# Patient Record
Sex: Male | Born: 1965 | Race: Black or African American | Hispanic: No | Marital: Single | Smoking: Never smoker
Health system: Southern US, Community
[De-identification: ages and names within clinical notes are randomized; demographics above are authoritative.]

## PROBLEM LIST (undated history)

## (undated) DIAGNOSIS — Z8711 Personal history of peptic ulcer disease: Secondary | ICD-10-CM

## (undated) DIAGNOSIS — Z8719 Personal history of other diseases of the digestive system: Secondary | ICD-10-CM

## (undated) DIAGNOSIS — I10 Essential (primary) hypertension: Secondary | ICD-10-CM

## (undated) HISTORY — PX: ABDOMINAL SURGERY: SHX537

---

## 2011-03-12 ENCOUNTER — Emergency Department: Payer: Self-pay | Admitting: Emergency Medicine

## 2015-01-17 ENCOUNTER — Other Ambulatory Visit: Payer: Self-pay | Admitting: Thoracic Surgery

## 2015-01-17 ENCOUNTER — Ambulatory Visit
Admission: RE | Admit: 2015-01-17 | Discharge: 2015-01-17 | Disposition: A | Discharge status: Home / Self Care | Payer: Disability Insurance | Source: Ambulatory Visit | Attending: Thoracic Surgery | Admitting: Thoracic Surgery

## 2015-01-17 DIAGNOSIS — M1711 Unilateral primary osteoarthritis, right knee: Secondary | ICD-10-CM | POA: Diagnosis not present

## 2015-01-17 DIAGNOSIS — M1990 Unspecified osteoarthritis, unspecified site: Secondary | ICD-10-CM

## 2015-01-17 DIAGNOSIS — M25562 Pain in left knee: Secondary | ICD-10-CM | POA: Diagnosis present

## 2015-01-17 DIAGNOSIS — M25561 Pain in right knee: Secondary | ICD-10-CM | POA: Diagnosis present

## 2015-03-04 ENCOUNTER — Emergency Department
Admission: EM | Admit: 2015-03-04 | Discharge: 2015-03-04 | Discharge status: Against Medical Advice | Payer: Medicaid Other | Attending: Emergency Medicine | Admitting: Emergency Medicine

## 2015-03-04 ENCOUNTER — Encounter: Payer: Self-pay | Admitting: Emergency Medicine

## 2015-03-04 DIAGNOSIS — I10 Essential (primary) hypertension: Secondary | ICD-10-CM | POA: Insufficient documentation

## 2015-03-04 DIAGNOSIS — R109 Unspecified abdominal pain: Secondary | ICD-10-CM | POA: Diagnosis not present

## 2015-03-04 HISTORY — DX: Personal history of other diseases of the digestive system: Z87.19

## 2015-03-04 HISTORY — DX: Essential (primary) hypertension: I10

## 2015-03-04 HISTORY — DX: Personal history of peptic ulcer disease: Z87.11

## 2015-03-04 LAB — URINALYSIS COMPLETE WITH MICROSCOPIC (ARMC ONLY)
BILIRUBIN URINE: NEGATIVE
Bacteria, UA: NONE SEEN
Glucose, UA: NEGATIVE mg/dL
KETONES UR: NEGATIVE mg/dL
LEUKOCYTES UA: NEGATIVE
NITRITE: NEGATIVE
PH: 7 (ref 5.0–8.0)
Protein, ur: NEGATIVE mg/dL
Specific Gravity, Urine: 1.025 (ref 1.005–1.030)
Squamous Epithelial / HPF: NONE SEEN

## 2015-03-04 LAB — COMPREHENSIVE METABOLIC PANEL
ALK PHOS: 49 U/L (ref 38–126)
ALT: 23 U/L (ref 17–63)
AST: 25 U/L (ref 15–41)
Albumin: 4.4 g/dL (ref 3.5–5.0)
Anion gap: 6 (ref 5–15)
BUN: 12 mg/dL (ref 6–20)
CALCIUM: 9.4 mg/dL (ref 8.9–10.3)
CHLORIDE: 101 mmol/L (ref 101–111)
CO2: 32 mmol/L (ref 22–32)
CREATININE: 1.43 mg/dL — AB (ref 0.61–1.24)
GFR, EST NON AFRICAN AMERICAN: 55 mL/min — AB (ref >60)
Glucose, Bld: 109 mg/dL — ABNORMAL HIGH (ref 65–99)
Potassium: 4.4 mmol/L (ref 3.5–5.1)
Sodium: 139 mmol/L (ref 135–145)
TOTAL PROTEIN: 7.8 g/dL (ref 6.5–8.1)
Total Bilirubin: 0.9 mg/dL (ref 0.3–1.2)

## 2015-03-04 LAB — CBC
HCT: 45.7 % (ref 40.0–52.0)
Hemoglobin: 16.2 g/dL (ref 13.0–18.0)
MCH: 32.5 pg (ref 26.0–34.0)
MCHC: 35.5 g/dL (ref 32.0–36.0)
MCV: 91.6 fL (ref 80.0–100.0)
PLATELETS: 192 10*3/uL (ref 150–440)
RBC: 4.99 MIL/uL (ref 4.40–5.90)
RDW: 13.1 % (ref 11.5–14.5)
WBC: 5 10*3/uL (ref 3.8–10.6)

## 2015-03-04 LAB — LIPASE, BLOOD: LIPASE: 49 U/L (ref 11–51)

## 2015-03-04 NOTE — ED Notes (Signed)
Pt lactose intolerant and tried to new silk milk, now having abd cramping and pain started this am. Denies any vomiting or diarrhea.

## 2016-05-13 IMAGING — CR DG KNEE 1-2V*L*
1 series · 2 of 2 positions shown · non-contrast
Comparison: None.

CLINICAL DATA: Arthritis.

EXAM:
LEFT KNEE - 1-2 VIEW

[Series 1: dg knee 1-2 views left · 0.14mm/px · 2 of 2 slices shown]
[im 1/2]
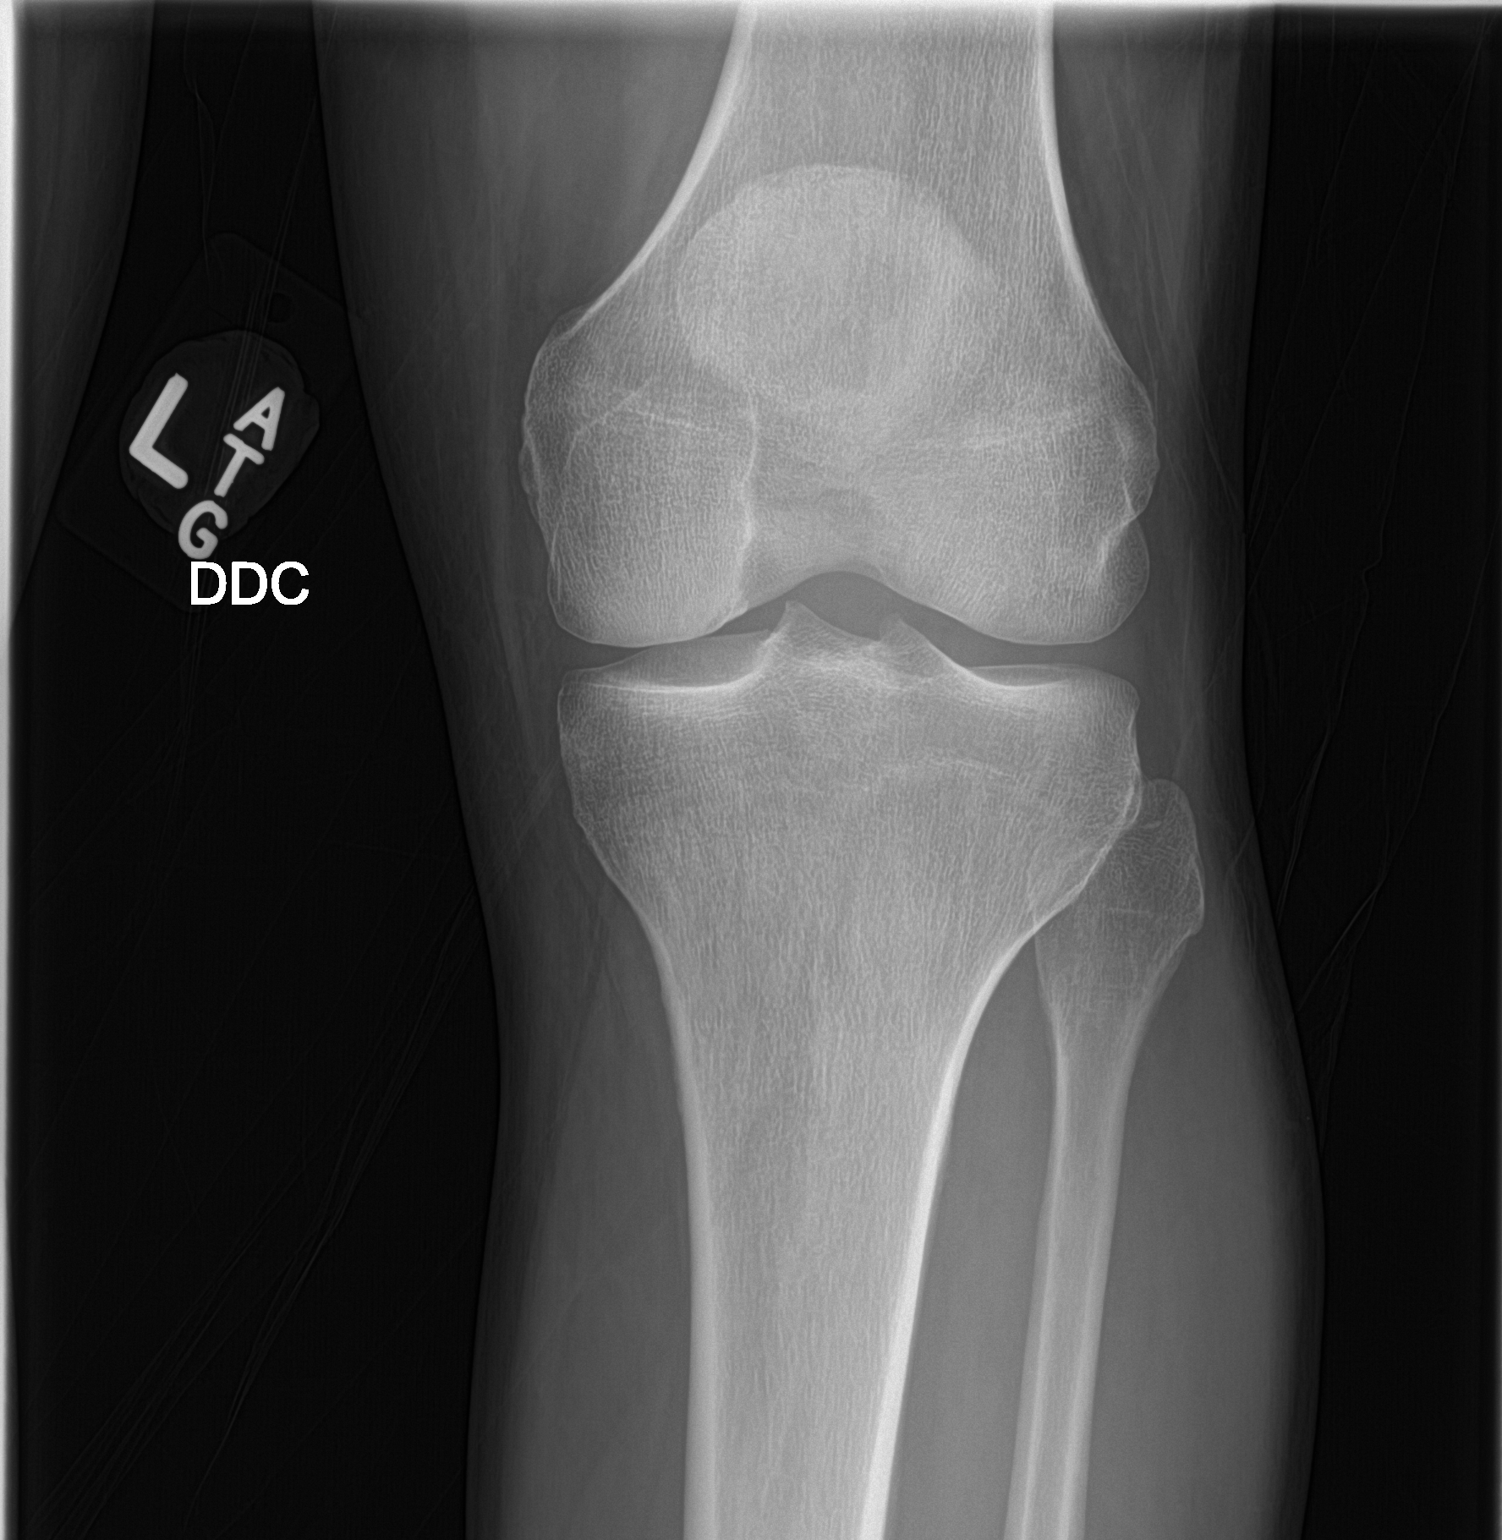
[im 2/2]
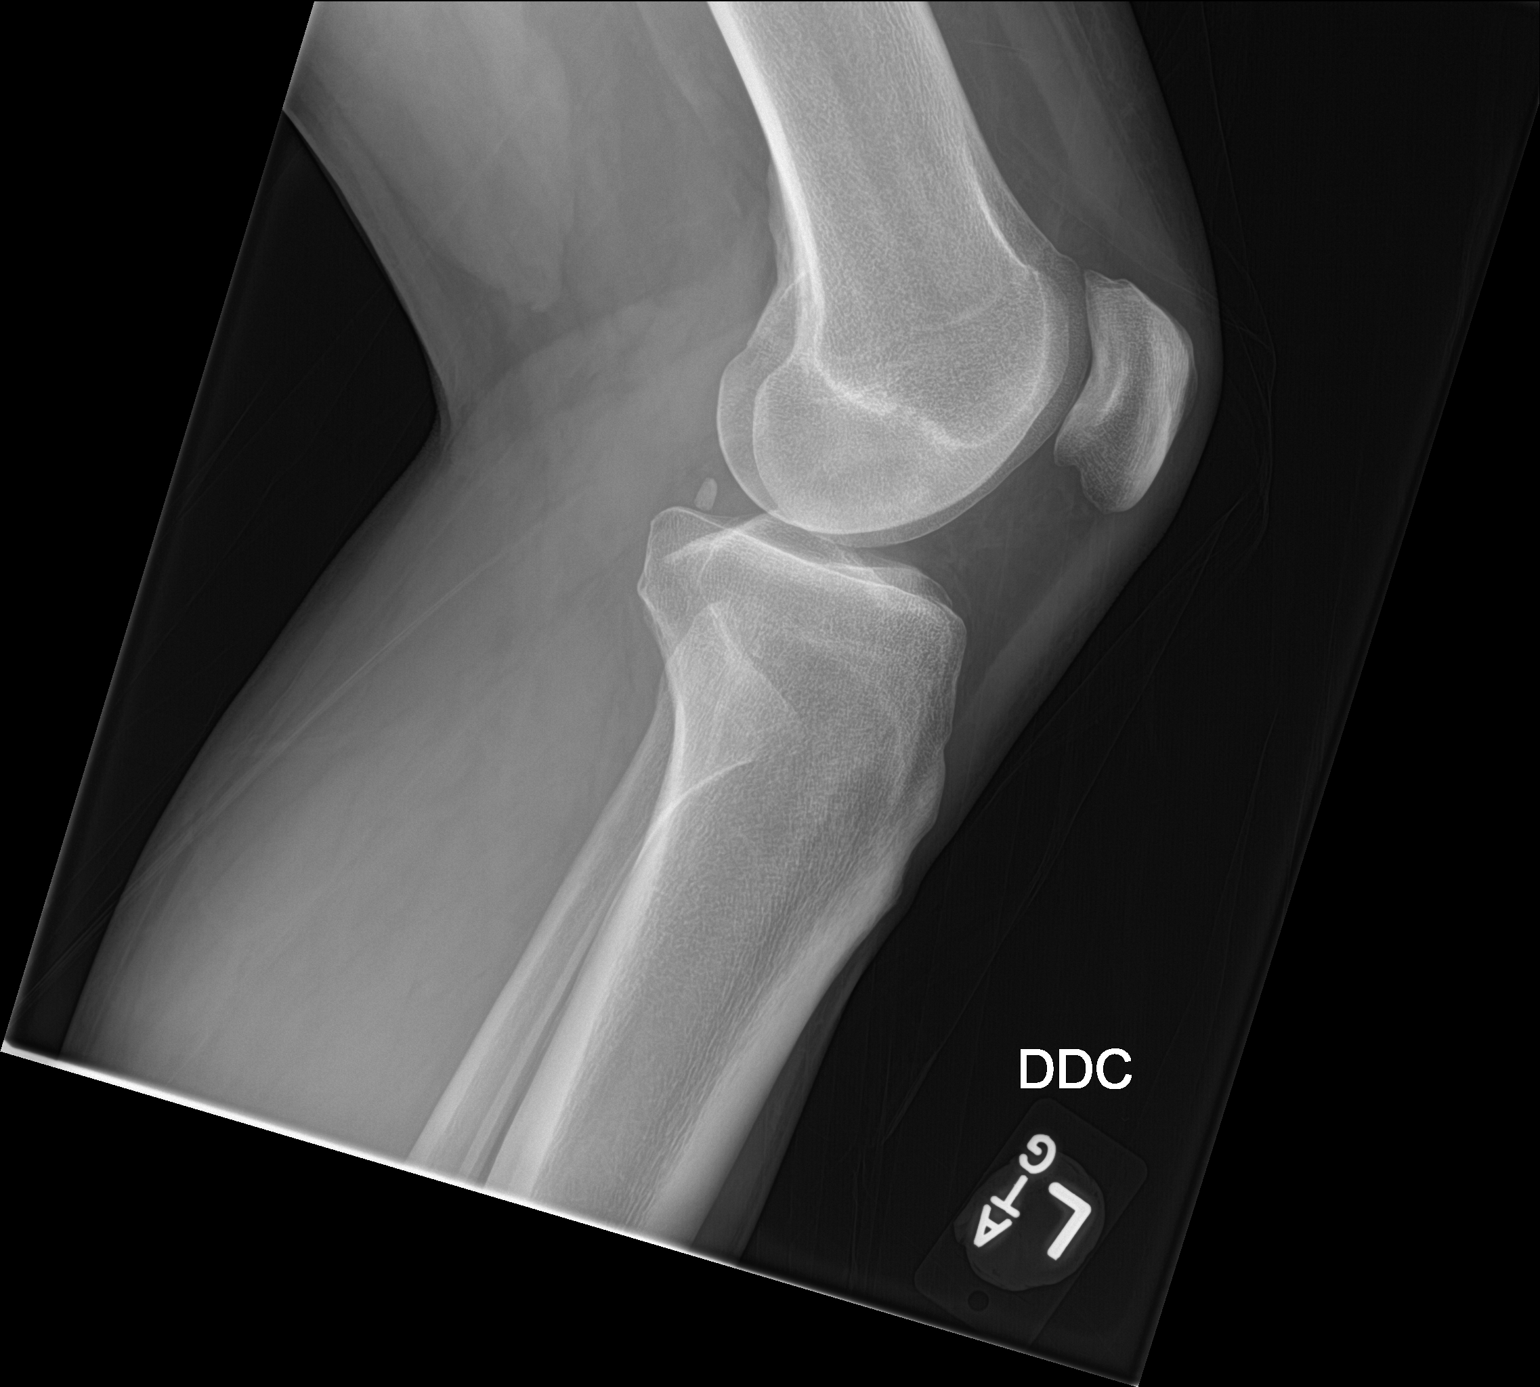

[2 of 2 positions shown; findings below may reference images not displayed]

FINDINGS: There is no evidence of fracture, dislocation, or joint effusion.
There is no evidence of arthropathy or other focal bone abnormality.
Soft tissues are unremarkable.
IMPRESSION: Negative.

## 2021-10-01 ENCOUNTER — Emergency Department
Admission: EM | Admit: 2021-10-01 | Discharge: 2021-10-02 | Disposition: A | Discharge status: Home / Self Care | Payer: Medicaid Other | Attending: Emergency Medicine | Admitting: Emergency Medicine

## 2021-10-01 ENCOUNTER — Other Ambulatory Visit: Payer: Self-pay

## 2021-10-01 DIAGNOSIS — K219 Gastro-esophageal reflux disease without esophagitis: Secondary | ICD-10-CM | POA: Insufficient documentation

## 2021-10-01 DIAGNOSIS — R001 Bradycardia, unspecified: Secondary | ICD-10-CM | POA: Diagnosis not present

## 2021-10-01 MED ORDER — ALUM & MAG HYDROXIDE-SIMETH 200-200-20 MG/5ML PO SUSP
30.0000 mL | Freq: Once | ORAL | Status: AC
  Administered 2021-10-01: 30 mL via ORAL
  Filled 2021-10-01: qty 30

## 2021-10-01 MED ORDER — LIDOCAINE VISCOUS HCL 2 % MT SOLN
15.0000 mL | Freq: Once | OROMUCOSAL | Status: AC
  Administered 2021-10-01: 15 mL via ORAL
  Filled 2021-10-01: qty 15

## 2021-10-01 NOTE — ED Triage Notes (Signed)
Pt arrives with c/o acid reflux that has gotten worse over the last few days. Per pt, he has the feeling of food getting stuck in his esophagus after eating. Per pt, it does pass, but takes time. Pt expressed relief with GI cocktails in the past.

## 2021-10-02 NOTE — Discharge Instructions (Signed)
Return to the ER for worsening symptoms, persistent vomiting, difficulty breathing or other concerns. °

## 2021-10-02 NOTE — ED Notes (Signed)
Pt's BP is elevated at D/C. Pt takes BP medications in the AM and pt says his BP goes up when he is hungry.

## 2021-10-02 NOTE — ED Provider Notes (Signed)
[facility] Provider Note    Event Date/Time   First MD Initiated Contact with Patient 10/02/21 0031     (approximate)   History   Gastroesophageal Reflux   HPI  Colin Singh is a 55 y.o. male who presents to the ED from home with a chief complaint of acid reflux.  Patient with a history of acid reflux on medications.  Reports a 2-week history after eating a hotdog from [employer] of feeling like food is getting stuck in his esophagus after eating.  No history of esophageal stricture.  History of peptic ulcer disease.  States he has had relief with GI cocktail in the past.  Denies fever, chills, chest pain, shortness of breath, abdominal pain, nausea, vomiting or diarrhea     Physical Exam   Triage Vital Signs: ED Triage Vitals  Enc Vitals Group     BP 10/01/21 2305 (!) 163/101     Pulse Rate 10/01/21 2305 (!) 55     Resp 10/01/21 2305 16     Temp 10/01/21 2305 98.3 F (36.8 C)     Temp Source 10/01/21 2305 Oral     SpO2 10/01/21 2305 99 %     Weight 10/01/21 2259 174 lb (78.9 kg)     Height --      Head Circumference --      Peak Flow --      Pain Score 10/01/21 2259 0     Pain Loc --      Pain Edu? --      Excl. in [place]? --     Most recent vital signs: Vitals:   10/01/21 2305 10/02/21 0030  BP: (!) 163/101 (!) 187/97  Pulse: (!) 55 (!) 57  Resp: 16 18  Temp: 98.3 F (36.8 C) (!) 97.4 F (36.3 C)  SpO2: 99% 100%     General: Awake, no distress.  CV:  RRR.  Good peripheral perfusion.  Resp:  Normal effort.  CTA B. Abd:  Nontender to light or deep palpation.  No distention.  Other:  No vesicles.   ED Results / Procedures / Treatments   Labs (all labs ordered are listed, but only abnormal results are displayed) Labs Reviewed - No data to display   EKG  ED ECG REPORT I, Joshua Zeringue J, the attending physician, personally viewed and interpreted this ECG.   Date: 10/02/2021  EKG Time: 2304  Rate: 53  Rhythm: sinus  bradycardia  Axis: Normal  Intervals:none  ST&T Change: Nonspecific    RADIOLOGY None    PROCEDURES:  Critical Care performed: No  Procedures   MEDICATIONS ORDERED IN ED: Medications  alum & mag hydroxide-simeth (MAALOX/MYLANTA) 200-200-20 MG/5ML suspension 30 mL (30 mLs Oral Given 10/01/21 2351)    And  lidocaine (XYLOCAINE) 2 % viscous mouth solution 15 mL (15 mLs Oral Given 10/01/21 2351)     IMPRESSION / MDM / ASSESSMENT AND PLAN / ED COURSE  I reviewed the triage vital signs and the nursing notes.                             56 year old male presenting with GERD, dysphagia.  No focal neurological deficits noted on examination.  Patient's presentation is most consistent with exacerbation of chronic illness.  Pain improved after GI cocktail.  Will refer patient to GI for outpatient follow-up.  Strict return precautions given.  Patient and spouse verbalized understanding agree with plan of  care.       FINAL CLINICAL IMPRESSION(S) / ED DIAGNOSES   Final diagnoses:  Gastroesophageal reflux disease, unspecified whether esophagitis present     Rx / DC Orders   ED Discharge Orders     None        Note:  This document was prepared using Dragon voice recognition software and may include unintentional dictation errors.   Paulette Blanch, MD 10/02/21 [phone]
